# Patient Record
Sex: Male | Born: 1965 | Race: White | Hispanic: No | Marital: Married | Smoking: Current every day smoker
Health system: Southern US, Community
[De-identification: ages and names within clinical notes are randomized; demographics above are authoritative.]

## PROBLEM LIST (undated history)

## (undated) HISTORY — PX: HERNIA REPAIR: SHX51

## (undated) HISTORY — PX: INNER EAR SURGERY: SHX679

---

## 2019-05-16 ENCOUNTER — Emergency Department
Admission: EM | Admit: 2019-05-16 | Discharge: 2019-05-16 | Disposition: A | Discharge status: Home / Self Care | Payer: BLUE CROSS/BLUE SHIELD | Source: Home / Self Care

## 2019-05-16 ENCOUNTER — Other Ambulatory Visit: Payer: Self-pay

## 2019-05-16 ENCOUNTER — Emergency Department (INDEPENDENT_AMBULATORY_CARE_PROVIDER_SITE_OTHER): Payer: BLUE CROSS/BLUE SHIELD

## 2019-05-16 ENCOUNTER — Encounter: Payer: Self-pay | Admitting: *Deleted

## 2019-05-16 DIAGNOSIS — B9789 Other viral agents as the cause of diseases classified elsewhere: Secondary | ICD-10-CM

## 2019-05-16 DIAGNOSIS — R05 Cough: Secondary | ICD-10-CM | POA: Diagnosis not present

## 2019-05-16 DIAGNOSIS — R062 Wheezing: Secondary | ICD-10-CM | POA: Diagnosis not present

## 2019-05-16 DIAGNOSIS — J069 Acute upper respiratory infection, unspecified: Secondary | ICD-10-CM

## 2019-05-16 MED ORDER — BENZONATATE 100 MG PO CAPS: 100.0000 mg | ORAL_CAPSULE | Freq: Three times a day (TID) | ORAL | 0 refills | Status: AC

## 2019-05-16 MED ORDER — PREDNISONE 20 MG PO TABS: ORAL_TABLET | ORAL | 0 refills | Status: AC

## 2019-05-16 MED ORDER — ALBUTEROL SULFATE HFA 108 (90 BASE) MCG/ACT IN AERS: 1.0000 | INHALATION_SPRAY | Freq: Four times a day (QID) | RESPIRATORY_TRACT | 0 refills | Status: AC | PRN

## 2019-05-16 NOTE — Discharge Instructions (Signed)
°  You may take 500mg  acetaminophen every 4-6 hours or in combination with ibuprofen 400-600mg  every 6-8 hours as needed for pain, inflammation, and fever.  Be sure to well hydrated with clear liquids and get at least 8 hours of sleep at night, preferably more while sick.   Please follow up with family medicine in 1 week if needed.  You may try the prednisone, cough medication and inhaler to help with inflammation in your lungs and your cough.

## 2019-05-16 NOTE — ED Provider Notes (Signed)
[facility]    CSN: [id number] Arrival date & time: 05/16/19  1049      History   Chief Complaint Chief Complaint  Patient presents with  . Cough    HPI Jesse Austin is a 53 y.o. male.   HPI  Jesse Austin is a 53 y.o. male presenting to UC with c/o   History reviewed. No pertinent past medical history.  There are no active problems to display for this patient.   Past Surgical History:  Procedure Laterality Date  . HERNIA REPAIR    . INNER EAR SURGERY         Home Medications    Prior to Admission medications   Medication Sig Start Date End Date Taking? Authorizing Provider  albuterol (VENTOLIN HFA) 108 (90 Base) MCG/ACT inhaler Inhale 1-2 puffs into the lungs every 6 (six) hours as needed for wheezing or shortness of breath. 05/16/19   Lurene Shadow, PA-C  benzonatate (TESSALON) 100 MG capsule Take 1-2 capsules (100-200 mg total) by mouth every 8 (eight) hours. 05/16/19   Lurene Shadow, PA-C  predniSONE (DELTASONE) 20 MG tablet 3 tabs po day one, then 2 po daily x 4 days 05/16/19   Lurene Shadow, PA-C    Family History Family History  Problem Relation Age of Onset  . Colon cancer Father     Social History Social History   Tobacco Use  . Smoking status: Current Every Day Smoker    Packs/day: 1.00    Types: Cigarettes  . Smokeless tobacco: Never Used  Substance Use Topics  . Alcohol use: Yes    Comment: OCC  . Drug use: Never     Allergies   Bee venom   Review of Systems Review of Systems  Constitutional: Negative for chills and fever.  HENT: Positive for congestion and sinus pressure. Negative for ear pain, sinus pain, sore throat, trouble swallowing and voice change.   Respiratory: Positive for cough and chest tightness. Negative for shortness of breath.   Cardiovascular: Negative for chest pain and palpitations.  Gastrointestinal: Negative for abdominal pain, diarrhea, nausea and vomiting.  Musculoskeletal: Negative  for arthralgias, back pain and myalgias.  Skin: Negative for rash.  Neurological: Negative for dizziness, light-headedness and headaches.     Physical Exam Triage Vital Signs ED Triage Vitals  Enc Vitals Group     BP 05/16/19 1123 (!) 158/89     Pulse Rate 05/16/19 1123 79     Resp 05/16/19 1123 16     Temp 05/16/19 1123 98.1 F (36.7 C)     Temp Source 05/16/19 1123 Oral     SpO2 05/16/19 1123 98 %     Weight 05/16/19 1107 145 lb (65.8 kg)     Height 05/16/19 1107 6' (1.829 m)     Head Circumference --      Peak Flow --      Pain Score 05/16/19 1107 0     Pain Loc --      Pain Edu? --      Excl. in GC? --    No data found.  Updated Vital Signs BP (!) 158/89 (BP Location: Right Arm)   Pulse 79   Temp 98.1 F (36.7 C) (Oral)   Resp 16   Ht 6' (1.829 m)   Wt 145 lb (65.8 kg)   SpO2 98%   BMI 19.67 kg/m   Visual Acuity Right Eye Distance:   Left Eye Distance:   Bilateral Distance:  Right Eye Near:   Left Eye Near:    Bilateral Near:     Physical Exam Vitals signs and nursing note reviewed.  Constitutional:      Appearance: Normal appearance. He is well-developed.  HENT:     Head: Normocephalic and atraumatic.     Right Ear: Tympanic membrane and ear canal normal.     Left Ear: Tympanic membrane and ear canal normal.     Nose: Nose normal.     Mouth/Throat:     Mouth: Mucous membranes are moist.     Pharynx: Oropharynx is clear.  Neck:     Musculoskeletal: Normal range of motion.  Cardiovascular:     Rate and Rhythm: Normal rate and regular rhythm.  Pulmonary:     Effort: Pulmonary effort is normal. No respiratory distress.     Breath sounds: No stridor. Wheezing (Right side lung field) present. No rhonchi or rales.  Musculoskeletal: Normal range of motion.  Skin:    General: Skin is warm and dry.  Neurological:     Mental Status: He is alert and oriented to person, place, and time.  Psychiatric:        Behavior: Behavior normal.      UC  Treatments / Results  Labs (all labs ordered are listed, but only abnormal results are displayed) Labs Reviewed - No data to display  EKG   Radiology Dg Chest 2 View  Result Date: 05/16/2019 CLINICAL DATA:  Cough congestion and wheezing. EXAM: CHEST - 2 VIEW COMPARISON:  None. FINDINGS: Heart size is normal. The lungs are mildly hyperinflated. No signs of consolidation or pleural effusion. No acute bone process. IMPRESSION: Mild-to-moderate hyperinflation, no signs of acute cardiopulmonary disease. Electronically Signed   By: Zetta Bills M.D.   On: 05/16/2019 11:41    Procedures Procedures (including critical care time)  Medications Ordered in UC Medications - No data to display  Initial Impression / Assessment and Plan / UC Course  I have reviewed the triage vital signs and the nursing notes.  Pertinent labs & imaging results that were available during my care of the patient were reviewed by me and considered in my medical decision making (see chart for details).     Wheeze in Right lung fields.  CXR- no evidence of pneumonia Will tx symptomatically for viral URI AVS provided.  Final Clinical Impressions(s) / UC Diagnoses   Final diagnoses:  Viral URI with cough     Discharge Instructions      You may take 500mg  acetaminophen every 4-6 hours or in combination with ibuprofen 400-600mg  every 6-8 hours as needed for pain, inflammation, and fever.  Be sure to well hydrated with clear liquids and get at least 8 hours of sleep at night, preferably more while sick.   Please follow up with family medicine in 1 week if needed.  You may try the prednisone, cough medication and inhaler to help with inflammation in your lungs and your cough.      ED Prescriptions    Medication Sig Dispense Auth. Provider   predniSONE (DELTASONE) 20 MG tablet 3 tabs po day one, then 2 po daily x 4 days 11 tablet Esparanza Krider O, PA-C   albuterol (VENTOLIN HFA) 108 (90 Base) MCG/ACT  inhaler Inhale 1-2 puffs into the lungs every 6 (six) hours as needed for wheezing or shortness of breath. 18 g Mykaela Arena O, PA-C   benzonatate (TESSALON) 100 MG capsule Take 1-2 capsules (100-200 mg total) by mouth every  8 (eight) hours. 21 capsule Lurene Shadow, [place]     PDMP not reviewed this encounter.   Lurene Shadow, [place] 05/16/19 1318

## 2019-05-16 NOTE — ED Triage Notes (Signed)
Pt c/o productive cough and chest tightness x 5 days. Denies fever. Reports negative rapid COVID test yesterday at another urgent care.

## 2021-05-18 IMAGING — DX DG CHEST 2V
2 series · 2 of 2 positions shown · non-contrast
Comparison: None.

CLINICAL DATA: Cough congestion and wheezing.

EXAM:
CHEST - 2 VIEW

[chest pa]
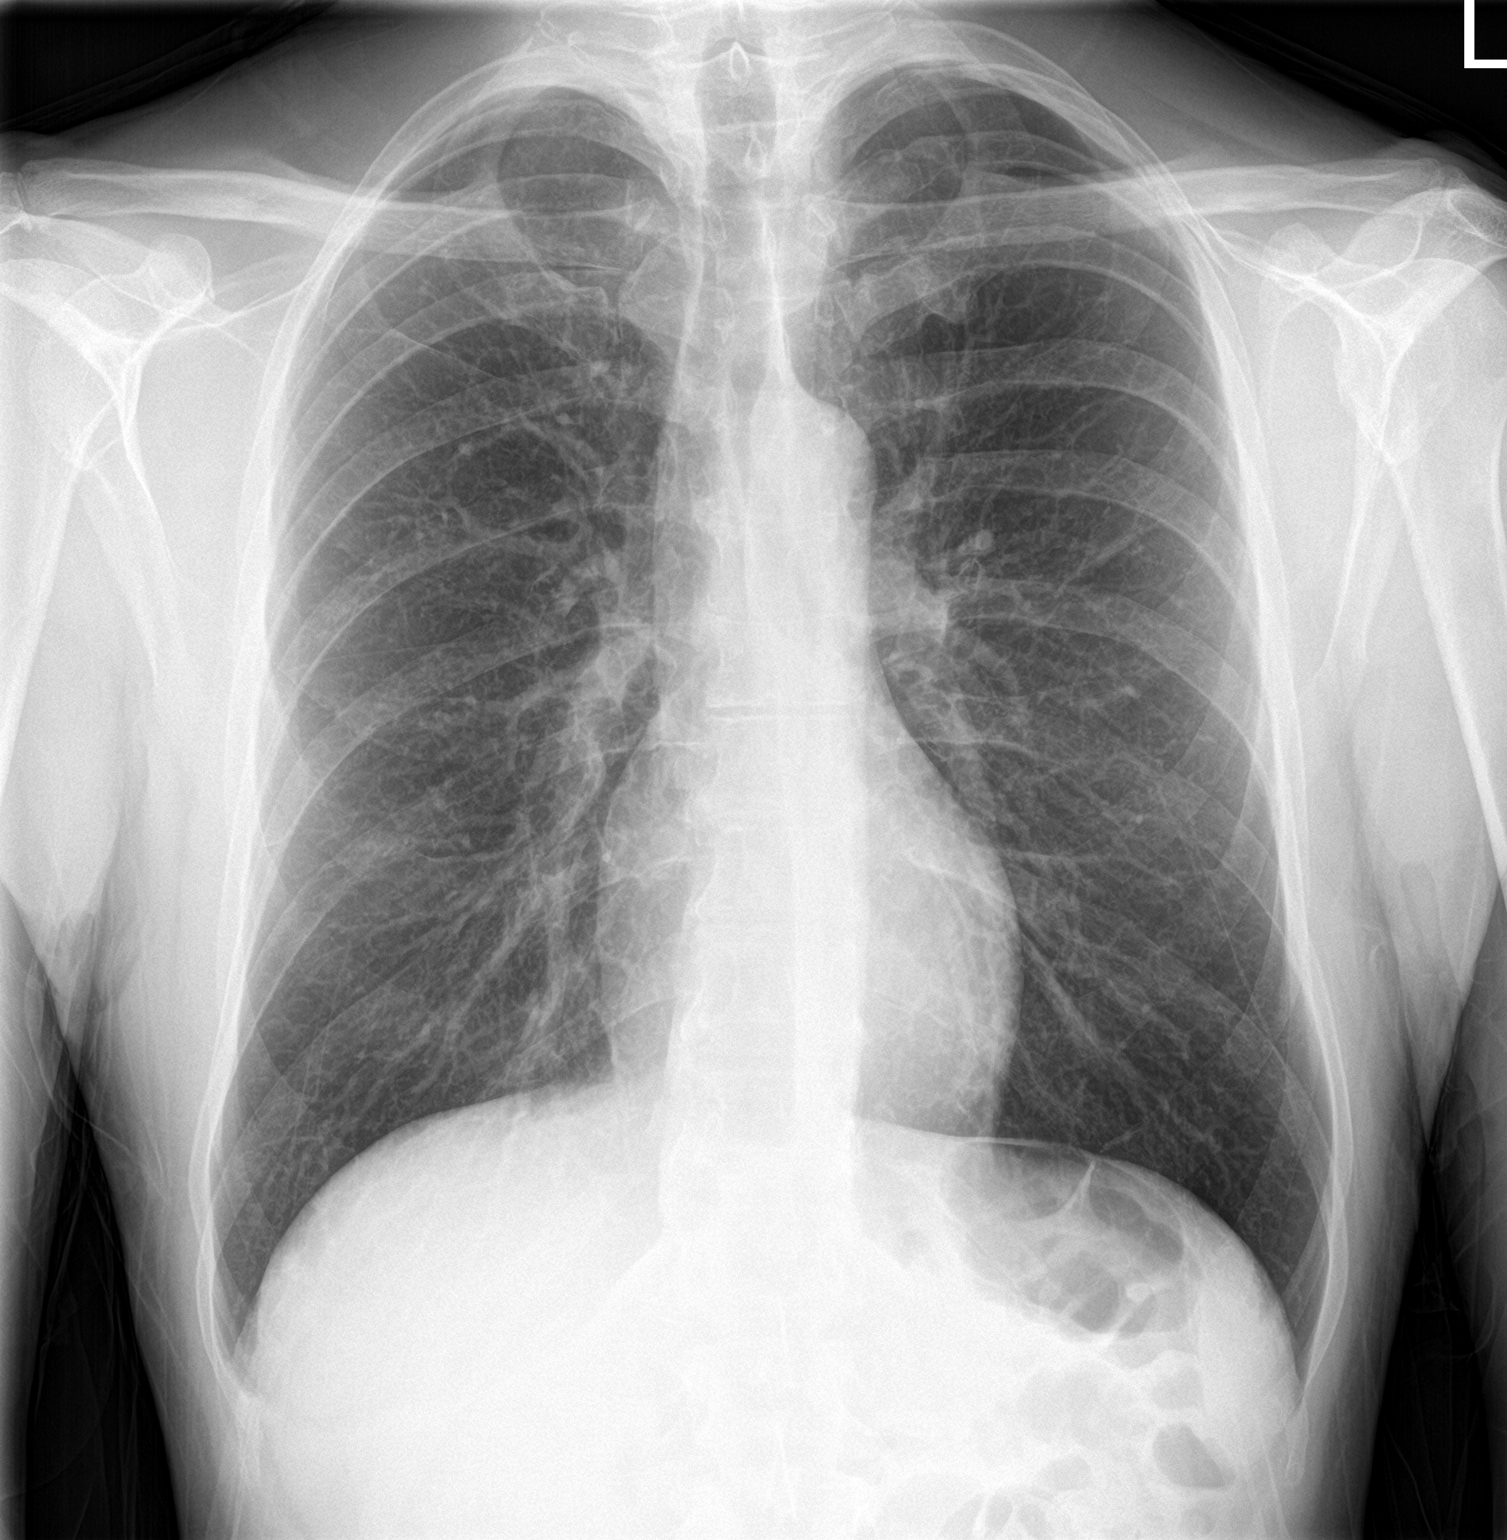

[chest lat]
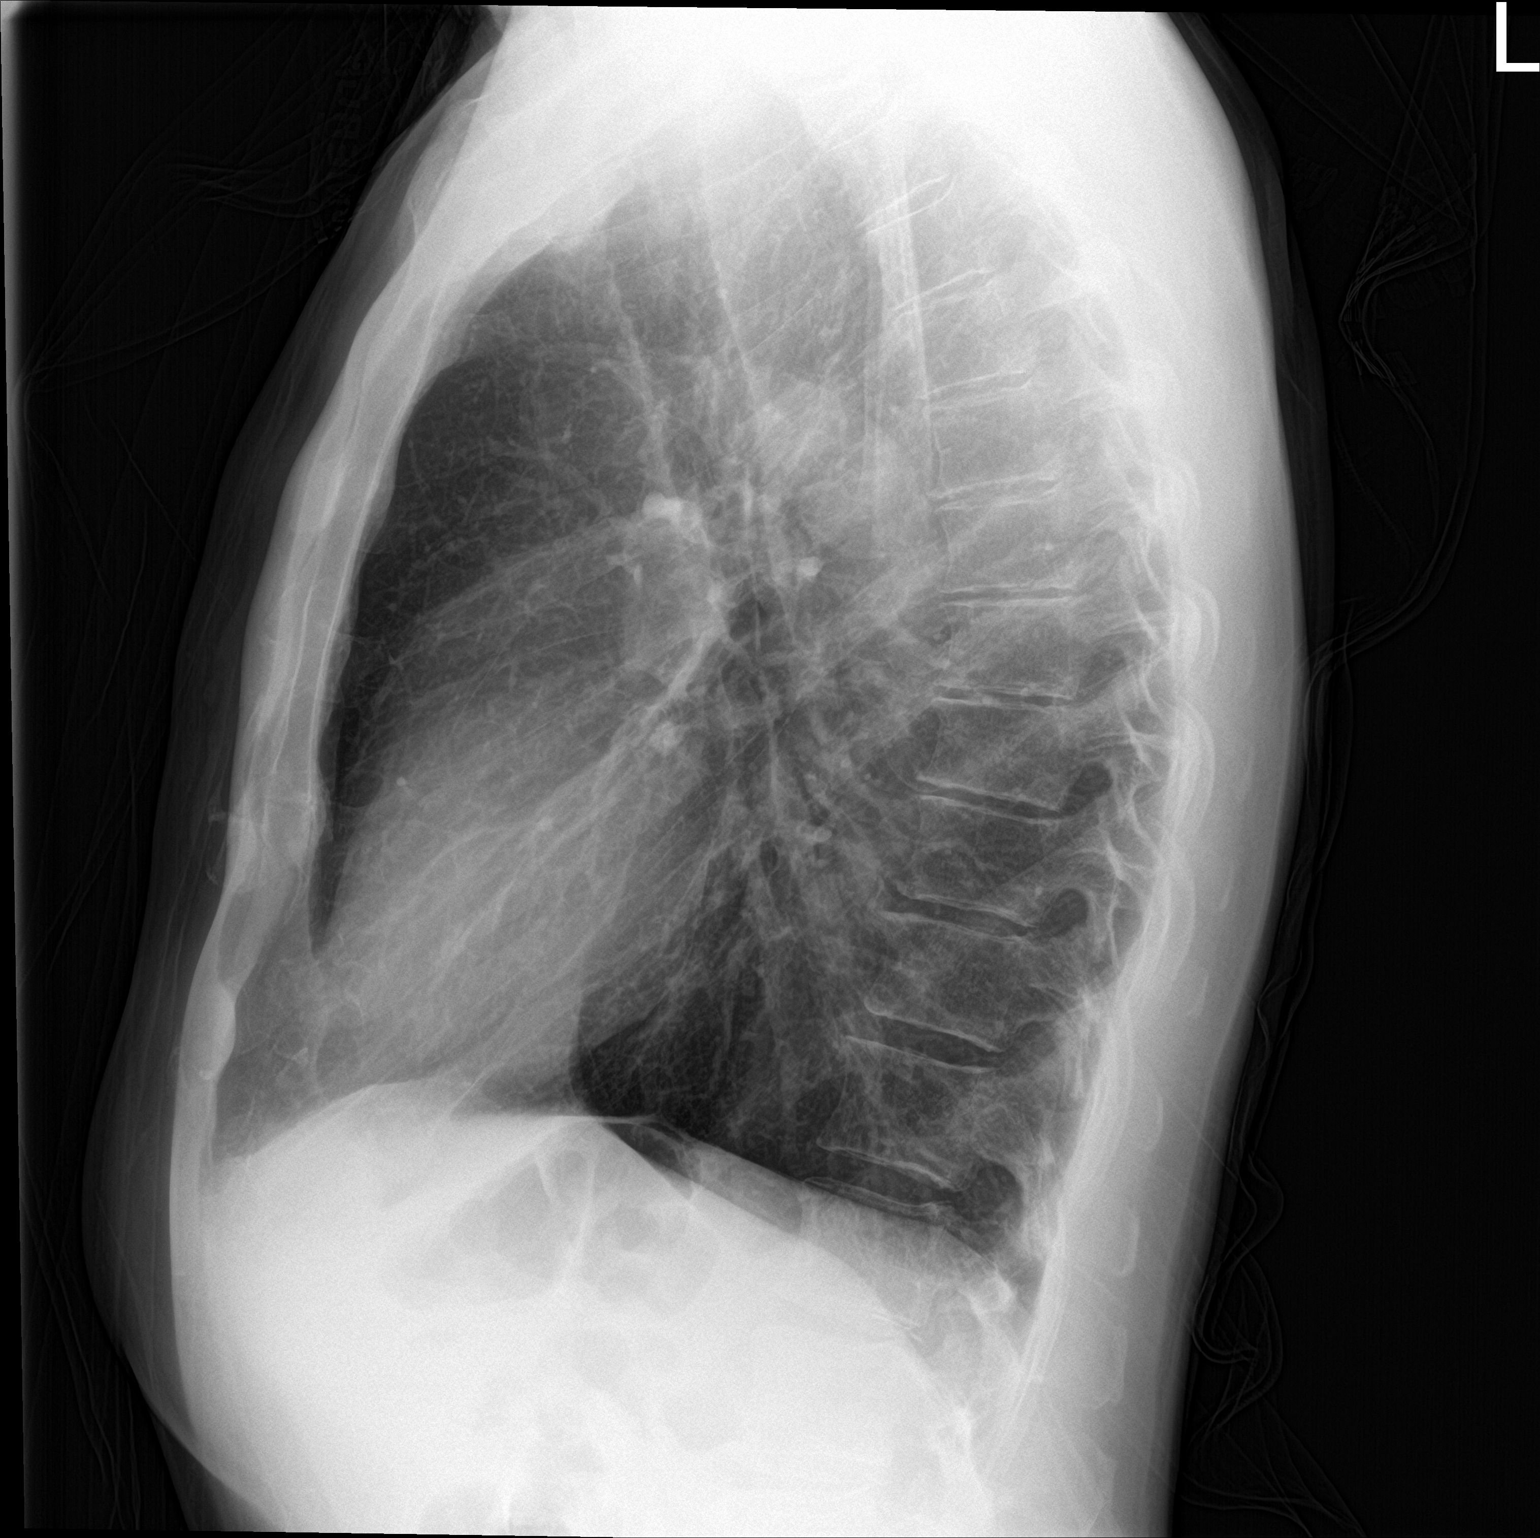

[2 of 2 positions shown; findings below may reference images not displayed]

FINDINGS: Heart size is normal. The lungs are mildly hyperinflated. No signs
of consolidation or pleural effusion.

No acute bone process.
IMPRESSION: Mild-to-moderate hyperinflation, no signs of acute cardiopulmonary
disease.
